# Patient Record
Sex: Male | Born: 1995 | Smoking: Never smoker
Health system: Southern US, Community
[De-identification: ages and names within clinical notes are randomized; demographics above are authoritative.]

---

## 2016-07-19 ENCOUNTER — Ambulatory Visit (INDEPENDENT_AMBULATORY_CARE_PROVIDER_SITE_OTHER): Payer: Self-pay | Admitting: Podiatry

## 2016-07-19 ENCOUNTER — Encounter: Payer: Self-pay | Admitting: Podiatry

## 2016-07-19 VITALS — Resp 16 | Ht 67.0 in | Wt 160.0 lb

## 2016-07-19 DIAGNOSIS — L600 Ingrowing nail: Secondary | ICD-10-CM | POA: Diagnosis not present

## 2016-07-19 NOTE — Patient Instructions (Signed)

## 2016-07-21 NOTE — Progress Notes (Signed)
Subjective:     Patient ID: Frederick Shields, male   DOB: 1995/12/21, 21 y.o.   MRN: [id number]  HPI patient presents with painful hallux nail disease left over right with incurvation of the bed redness soreness and inability to take care of it himself   Review of Systems  All other systems reviewed and are negative.      Objective:   Physical Exam  Constitutional: He is oriented to person, place, and time.  Cardiovascular: Intact distal pulses.   Musculoskeletal: Normal range of motion.  Neurological: He is oriented to person, place, and time.  Skin: Skin is warm.  Nursing note and vitals reviewed.  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 Today I infiltrated the left hallux 60 Milligan times like Marcaine mixture remove medial border exposed matrix and applied phenol 3 applications 30 seconds followed by alcohol lavage and sterile dressing. Gave instructions on soaks and reappoint

## 2019-09-01 ENCOUNTER — Encounter (INDEPENDENT_AMBULATORY_CARE_PROVIDER_SITE_OTHER): Payer: Self-pay | Admitting: Otolaryngology

## 2019-09-01 ENCOUNTER — Ambulatory Visit (INDEPENDENT_AMBULATORY_CARE_PROVIDER_SITE_OTHER): Payer: BC Managed Care – PPO | Admitting: Otolaryngology

## 2019-09-01 ENCOUNTER — Other Ambulatory Visit: Payer: Self-pay

## 2019-09-01 VITALS — Temp 98.1°F

## 2019-09-01 DIAGNOSIS — H6123 Impacted cerumen, bilateral: Secondary | ICD-10-CM

## 2019-09-01 NOTE — Progress Notes (Signed)
HPI: Frederick Shields is a 24 y.o. male who presents for evaluation of wax buildup in both ears which is worse on the right side.  He has had this cleaned previously close to his home town [place].Marland Kitchen  No past medical history on file.  Social History   Socioeconomic History  . Marital status: Unknown    Spouse name: Not on file  . Number of children: Not on file  . Years of education: Not on file  . Highest education level: Not on file  Occupational History  . Not on file  Tobacco Use  . Smoking status: Never Smoker  . Smokeless tobacco: Never Used  Substance and Sexual Activity  . Alcohol use: Not on file  . Drug use: Not on file  . Sexual activity: Not on file  Other Topics Concern  . Not on file  Social History Narrative  . Not on file   Social Determinants of Health   Financial Resource Strain:   . Difficulty of Paying Living Expenses:   Food Insecurity:   . Worried About Programme researcher, broadcasting/film/video in the Last Year:   . Barista in the Last Year:   Transportation Needs:   . Freight forwarder (Medical):   Marland Kitchen Lack of Transportation (Non-Medical):   Physical Activity:   . Days of Exercise per Week:   . Minutes of Exercise per Session:   Stress:   . Feeling of Stress :   Social Connections:   . Frequency of Communication with Friends and Family:   . Frequency of Social Gatherings with Friends and Family:   . Attends Religious Services:   . Active Member of Clubs or Organizations:   . Attends Banker Meetings:   Marland Kitchen Marital Status:    No family history on file. No Known Allergies Prior to Admission medications   Not on File     Positive ROS: Otherwise negative  All other systems have been reviewed and were otherwise negative with the exception of those mentioned in the HPI and as above.  Physical Exam: Constitutional: Alert, well-appearing, no acute distress Ears: External ears without lesions or tenderness. Ear canals with a large amount of wax  in both ear canals worse on the right side.  This was cleaned in the office using hydroperoxide and suction.  TMs were clear bilaterally.. Nasal: External nose without lesions. Clear nasal passages Oral: Oropharynx clear. Neck: No palpable adenopathy or masses Respiratory: Breathing comfortably  Skin: No facial/neck lesions or rash noted.  Cerumen impaction removal  Date/Time: 09/01/2019 12:20 PM Performed by: Drema Halon, MD Authorized by: Drema Halon, MD   Consent:    Consent obtained:  Verbal   Consent given by:  Patient   Risks discussed:  Pain and bleeding Procedure details:    Location:  L ear and R ear   Procedure type: curette, irrigation and suction   Post-procedure details:    Inspection:  TM intact and canal normal   Hearing quality:  Improved   Patient tolerance of procedure:  Tolerated well, no immediate complications Comments:     Patient with a large amount of wax in both ears that was cleaned with hydroperoxide and suction.  TMs were clear bilaterally.    Assessment: Bilateral cerumen impactions  Plan: This was cleaned in the office he will follow-up as needed.  Narda Bonds, MD

## 2020-06-28 ENCOUNTER — Emergency Department (HOSPITAL_COMMUNITY): Payer: BC Managed Care – PPO

## 2020-06-28 ENCOUNTER — Encounter (HOSPITAL_COMMUNITY): Payer: Self-pay

## 2020-06-28 ENCOUNTER — Emergency Department (HOSPITAL_COMMUNITY)
Admission: EM | Admit: 2020-06-28 | Discharge: 2020-06-29 | Disposition: A | Discharge status: Home / Self Care | Payer: BC Managed Care – PPO | Attending: Emergency Medicine | Admitting: Emergency Medicine

## 2020-06-28 DIAGNOSIS — Y92410 Unspecified street and highway as the place of occurrence of the external cause: Secondary | ICD-10-CM | POA: Insufficient documentation

## 2020-06-28 DIAGNOSIS — S60921A Unspecified superficial injury of right hand, initial encounter: Secondary | ICD-10-CM | POA: Diagnosis not present

## 2020-06-28 DIAGNOSIS — T1490XA Injury, unspecified, initial encounter: Secondary | ICD-10-CM

## 2020-06-28 DIAGNOSIS — M7150 Other bursitis, not elsewhere classified, unspecified site: Secondary | ICD-10-CM | POA: Insufficient documentation
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
## 2020-06-29 MED ORDER — NAPROXEN 500 MG PO TABS
500.0000 mg | ORAL_TABLET | Freq: Two times a day (BID) | ORAL | 0 refills | Status: AC
Start: 2020-06-29 — End: ?
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
